# Patient Record
Sex: Male | Born: 1965 | Race: Black or African American | Hispanic: No
Health system: Southern US, Community
[De-identification: ages and names within clinical notes are randomized; demographics above are authoritative.]

---

## 2020-12-23 ENCOUNTER — Emergency Department (HOSPITAL_COMMUNITY)
Admission: EM | Admit: 2020-12-23 | Discharge: 2020-12-24 | Disposition: A | Discharge status: Home / Self Care | Payer: No Typology Code available for payment source | Attending: Emergency Medicine | Admitting: Emergency Medicine

## 2020-12-23 ENCOUNTER — Other Ambulatory Visit: Payer: Self-pay

## 2020-12-23 DIAGNOSIS — M542 Cervicalgia: Secondary | ICD-10-CM | POA: Diagnosis not present

## 2020-12-23 DIAGNOSIS — M25572 Pain in left ankle and joints of left foot: Secondary | ICD-10-CM | POA: Insufficient documentation

## 2020-12-23 DIAGNOSIS — Y92410 Unspecified street and highway as the place of occurrence of the external cause: Secondary | ICD-10-CM | POA: Diagnosis not present

## 2020-12-23 DIAGNOSIS — M545 Low back pain, unspecified: Secondary | ICD-10-CM | POA: Diagnosis not present
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
## 2020-12-24 ENCOUNTER — Emergency Department (HOSPITAL_COMMUNITY): Payer: No Typology Code available for payment source

## 2020-12-24 MED ORDER — ACETAMINOPHEN 325 MG PO TABS
650.0000 mg | ORAL_TABLET | Freq: Once | ORAL | Status: AC
  Administered 2020-12-24: 650 mg via ORAL
  Filled 2020-12-24: qty 2

## 2020-12-24 MED ORDER — CYCLOBENZAPRINE HCL 10 MG PO TABS: 10.0000 mg | ORAL_TABLET | Freq: Two times a day (BID) | ORAL | 0 refills | Status: AC | PRN

## 2020-12-24 MED ORDER — LIDOCAINE 5 % EX PTCH
1.0000 | MEDICATED_PATCH | Freq: Once | CUTANEOUS | Status: DC
  Administered 2020-12-24: 1 via TRANSDERMAL
  Filled 2020-12-24: qty 1
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
